# Patient Record
Sex: Male | Born: 1990 | Race: Black or African American | Hispanic: No | Marital: Married | Smoking: Former smoker
Health system: Southern US, Community
[De-identification: ages and names within clinical notes are randomized; demographics above are authoritative.]

## PROBLEM LIST (undated history)

## (undated) HISTORY — PX: NO PAST SURGERIES: SHX2092

---
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
## 2012-05-07 IMAGING — CR DG CHEST 2V
1 series · 2 of 2 positions shown · non-contrast
Comparison: none

REASON FOR EXAM: chest pain, shortness of breath
COMMENTS:

PROCEDURE:     DXR - DXR CHEST PA (OR AP) AND LATERAL  - December 30, 2010  [DATE]
RESULT:     The lung fields are clear. The heart, mediastinal and osseous
structures show no significant abnormalities.

[Series 1: w chest pa · 0.14mm/px · 2 of 2 slices shown]
[im 1/2]
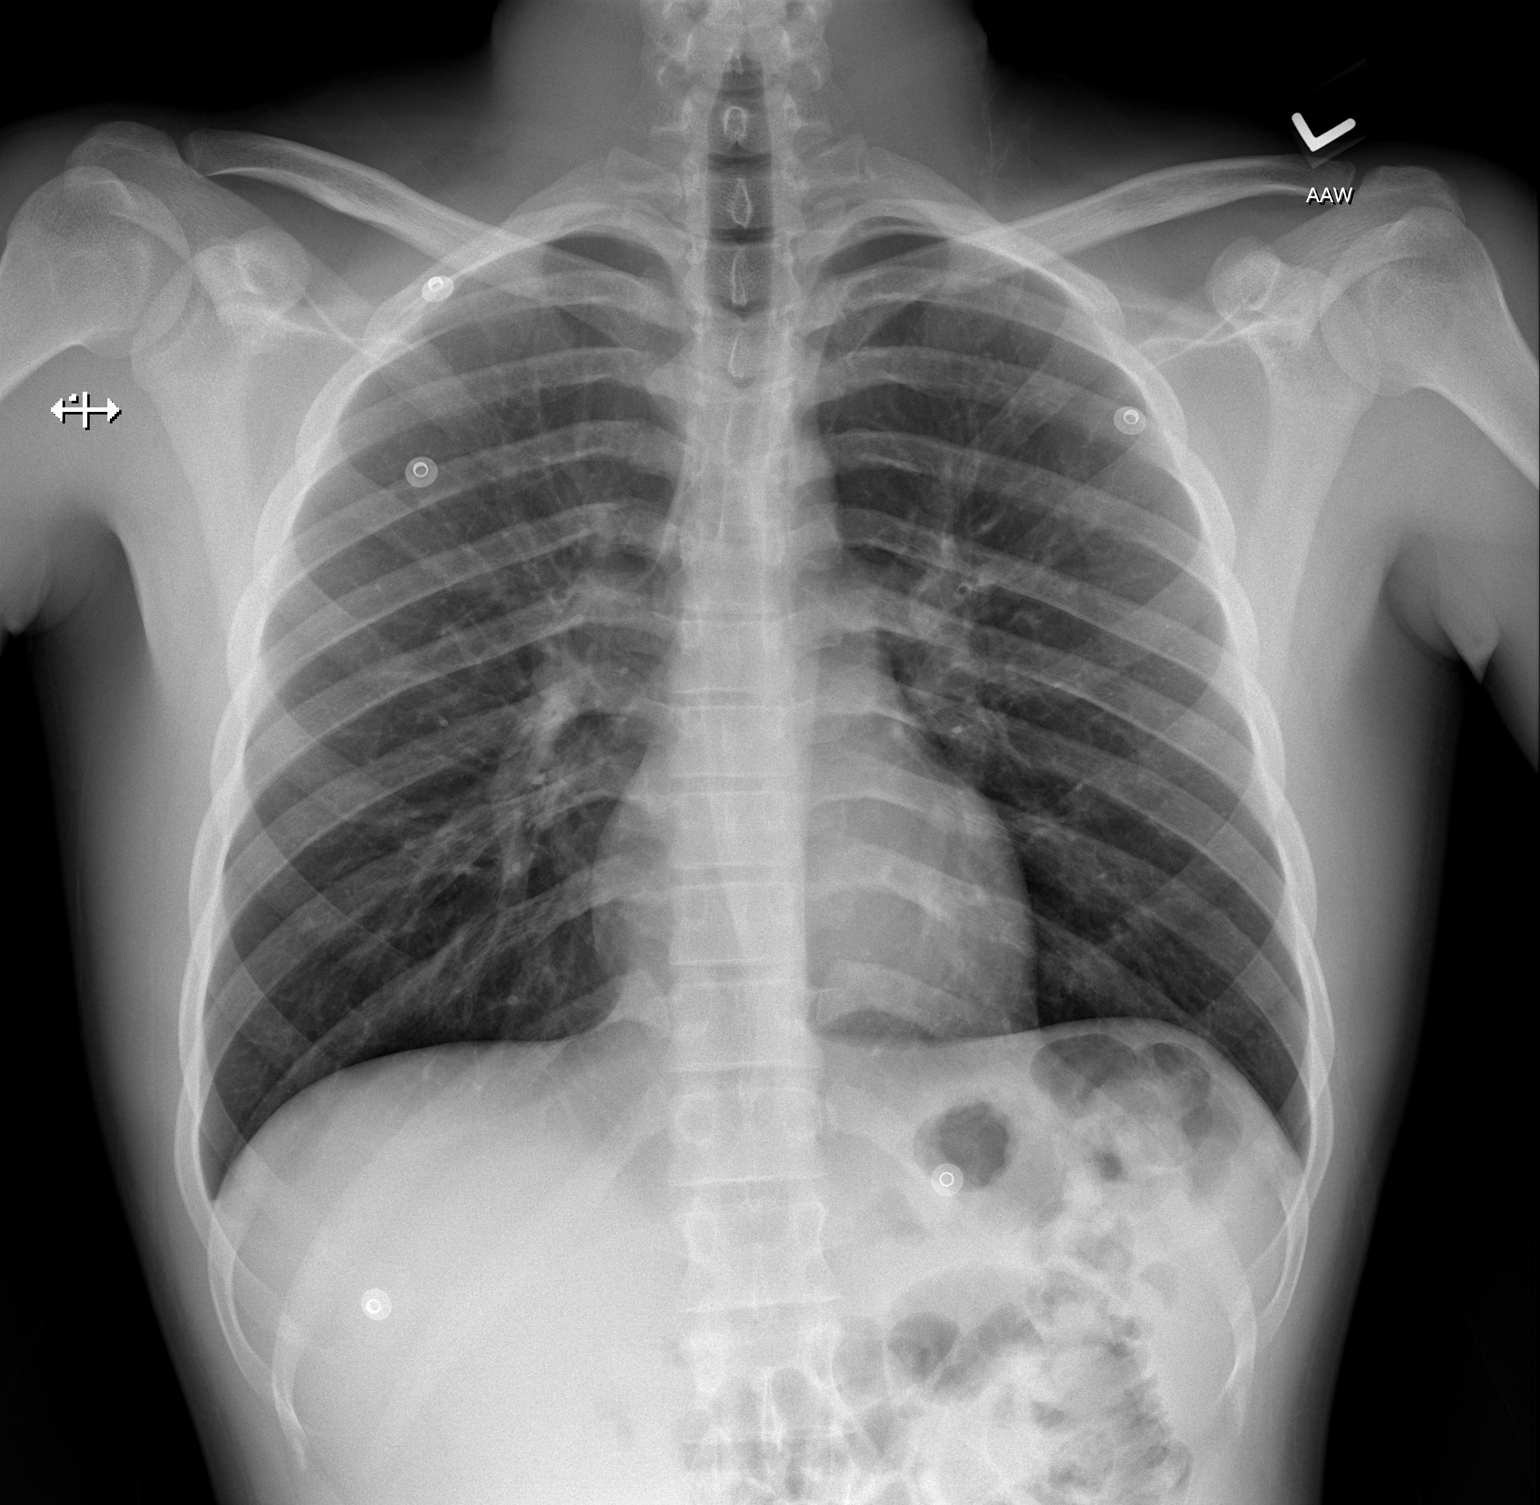
[im 2/2]
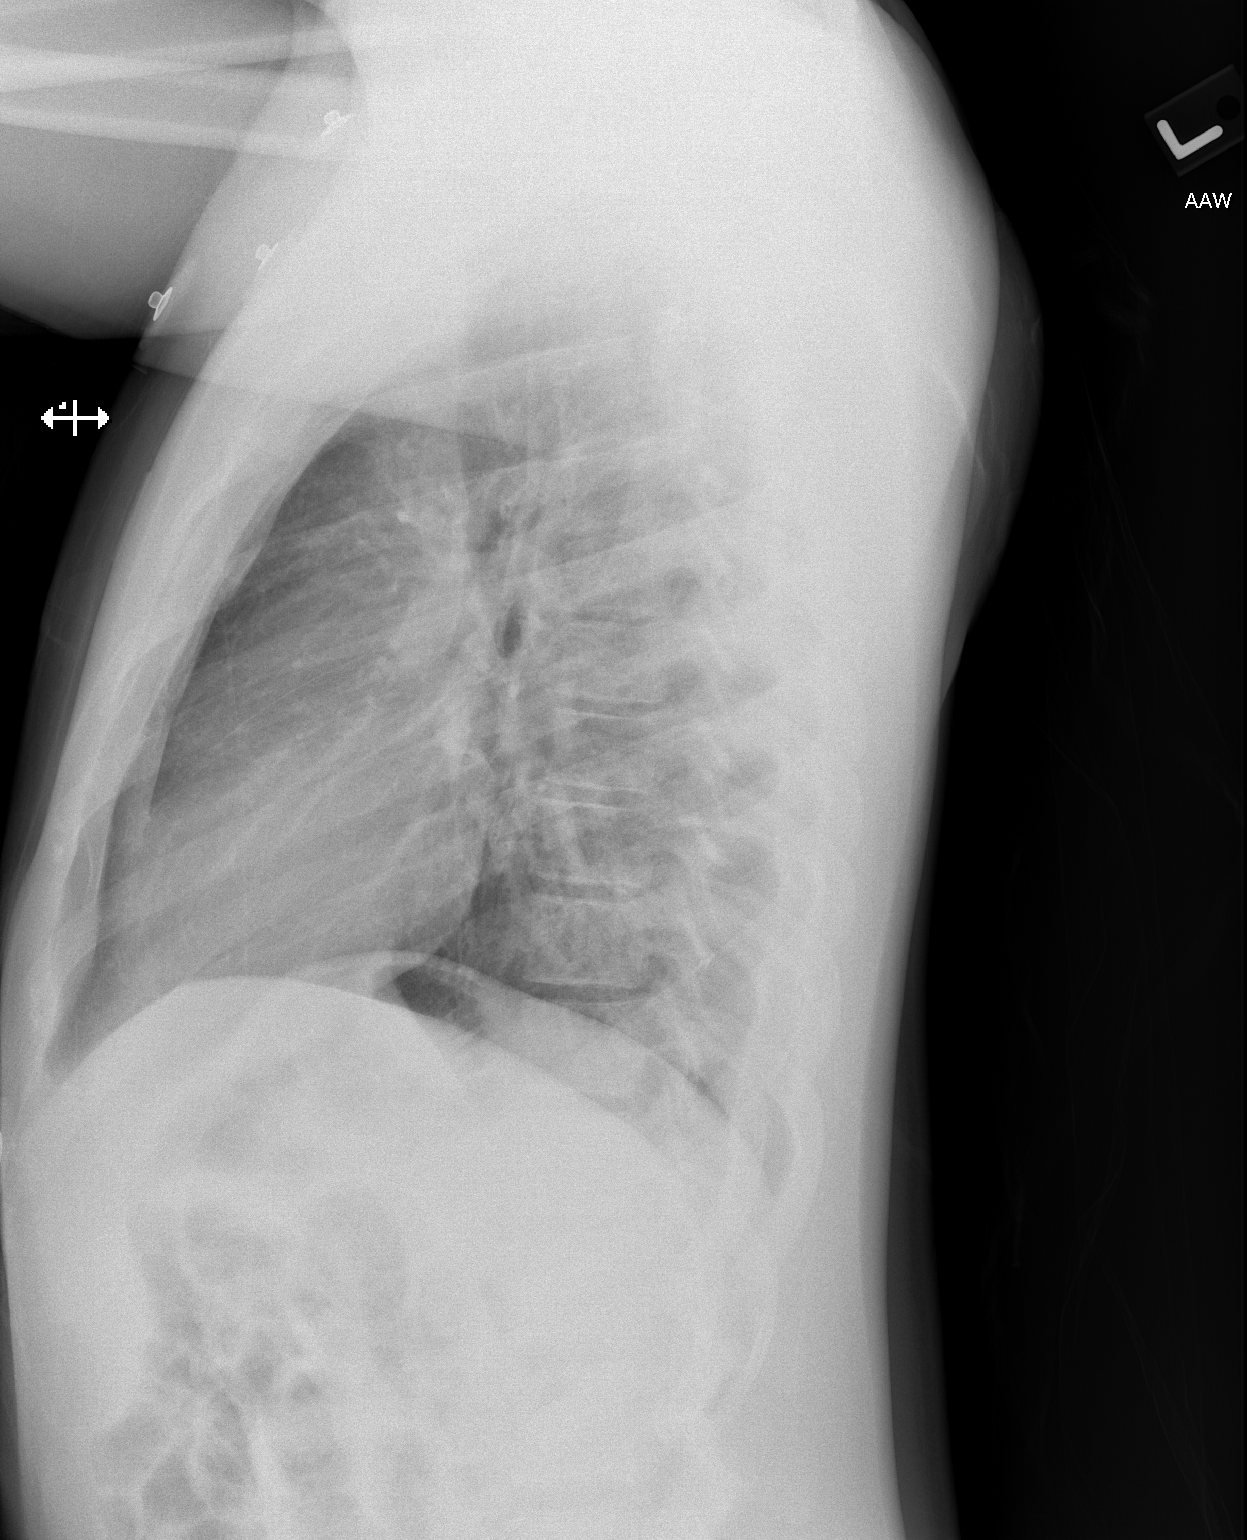

[2 of 2 positions shown; findings below may reference images not displayed]

IMPRESSION: 1.     No acute changes are identified.

## 2014-08-03 ENCOUNTER — Emergency Department
Admission: EM | Admit: 2014-08-03 | Discharge: 2014-08-03 | Disposition: A | Discharge status: Home / Self Care | Payer: Self-pay | Attending: Emergency Medicine | Admitting: Emergency Medicine

## 2014-08-03 ENCOUNTER — Encounter: Payer: Self-pay | Admitting: Emergency Medicine

## 2014-08-03 DIAGNOSIS — Z720 Tobacco use: Secondary | ICD-10-CM | POA: Insufficient documentation

## 2014-08-03 DIAGNOSIS — L02416 Cutaneous abscess of left lower limb: Secondary | ICD-10-CM | POA: Insufficient documentation

## 2014-08-03 MED ORDER — SULFAMETHOXAZOLE-TRIMETHOPRIM 800-160 MG PO TABS: 1.0000 | ORAL_TABLET | Freq: Two times a day (BID) | ORAL | Status: DC

## 2014-08-03 MED ORDER — HYDROCODONE-ACETAMINOPHEN 5-325 MG PO TABS
1.0000 | ORAL_TABLET | ORAL | Status: DC | PRN
Start: 2014-08-03 — End: 2017-04-27

## 2014-08-03 NOTE — ED Notes (Signed)
States he developed an abscess area to left groin area

## 2014-08-03 NOTE — ED Notes (Signed)
AAOx3.  Skin warm and dry.  Dry sterile dressing to left goin site, draining tan/brown purulent drainage.  Wound care discussed.  D/C home.

## 2014-08-03 NOTE — Discharge Instructions (Signed)
Abscess °An abscess (boil or furuncle) is an infected area on or under the skin. This area is filled with yellowish-white fluid (pus) and other material (debris). °HOME CARE  °· Only take medicines as told by your doctor. °· If you were given antibiotic medicine, take it as directed. Finish the medicine even if you start to feel better. °· If gauze is used, follow your doctor's directions for changing the gauze. °· To avoid spreading the infection: °¨ Keep your abscess covered with a bandage. °¨ Wash your hands well. °¨ Do not share personal care items, towels, or whirlpools with others. °¨ Avoid skin contact with others. °· Keep your skin and clothes clean around the abscess. °· Keep all doctor visits as told. °GET HELP RIGHT AWAY IF:  °· You have more pain, puffiness (swelling), or redness in the wound site. °· You have more fluid or blood coming from the wound site. °· You have muscle aches, chills, or you feel sick. °· You have a fever. °MAKE SURE YOU:  °· Understand these instructions. °· Will watch your condition. °· Will get help right away if you are not doing well or get worse. °Document Released: 06/29/2007 Document Revised: 07/12/2011 Document Reviewed: 03/25/2011 °ExitCare® Patient Information ©2015 [employer]. This information is not intended to replace advice given to you by your health care provider. Make sure you discuss any questions you have with your health care provider. ° °

## 2014-08-03 NOTE — ED Provider Notes (Signed)
[facility] Emergency Department Provider Note  ____________________________________________  Time seen: Approximately 1:37 PM  I have reviewed the triage vital signs and the nursing notes.   HISTORY  Chief Complaint Abscess   HPI Dennis Gaines is a 24 y.o. male is here with complaint of abscess in the left groin area for approximately 3 days. He states that he has had these before in various places and had multiple incisions made. He Denies any fever, nausea or vomiting.He states that with walking he is having pain due to the abscess. Currently his pain is 9 out of 10. He has not taken any over-the-counter medication for this.   History reviewed. No pertinent past medical history.  There are no active problems to display for this patient.   History reviewed. No pertinent past surgical history.  Current Outpatient Rx  Name  Route  Sig  Dispense  Refill  . HYDROcodone-acetaminophen (NORCO/VICODIN) 5-325 MG per tablet   Oral   Take 1 tablet by mouth every 4 (four) hours as needed for moderate pain.   20 tablet   0   . sulfamethoxazole-trimethoprim (BACTRIM DS,SEPTRA DS) 800-160 MG per tablet   Oral   Take 1 tablet by mouth 2 (two) times daily.   20 tablet   0     Allergies Review of patient's allergies indicates no known allergies.  No family history on file.  Social History History  Substance Use Topics  . Smoking status: Current Every Day Smoker  . Smokeless tobacco: Not on file  . Alcohol Use: Yes    Review of Systems Constitutional: No fever/chills Eyes: No visual changes. Cardiovascular: Denies chest pain. Respiratory: Denies shortness of breath. Gastrointestinal: No abdominal pain.  No nausea, no vomiting.  Genitourinary: Negative for dysuria. Musculoskeletal: Negative for back pain. Skin: Negative for rash. Neurological: Negative for headaches, focal weakness or numbness.  10-point ROS otherwise  negative.  ____________________________________________   PHYSICAL EXAM:  VITAL SIGNS: ED Triage Vitals  Enc Vitals Group     BP 08/03/14 1309 130/85 mmHg     Pulse Rate 08/03/14 1309 97     Resp 08/03/14 1309 18     Temp 08/03/14 1309 97.8 F (36.6 C)     Temp Source 08/03/14 1309 Oral     SpO2 08/03/14 1309 99 %     Weight 08/03/14 1309 225 lb (102.059 kg)     Height 08/03/14 1309 5\' 6"  (1.676 m)     Head Cir --      Peak Flow --      Pain Score 08/03/14 1310 9     Pain Loc --      Pain Edu? --      Excl. in GC? --     Constitutional: Alert and oriented. Well appearing and in no acute distress. Eyes: Conjunctivae are normal. PERRL. EOMI. Head: Atraumatic. Nose: No congestion/rhinnorhea. Mouth/Throat: Mucous membranes are moist.  Oropharynx non-erythematous. Neck: No stridor.   Cardiovascular: Normal rate, regular rhythm. Grossly normal heart sounds.  Good peripheral circulation. Respiratory: Normal respiratory effort.  No retractions. Lungs CTAB. Gastrointestinal: Soft and nontender. No distention. Musculoskeletal: No lower extremity tenderness nor edema.  No joint effusions. Neurologic:  Normal speech and language. No gross focal neurologic deficits are appreciated. Speech is normal. No gait instability. Skin:  Skin is warm, dry and intact. No rash noted. Left inner thigh area there is a 2 and half to 3 cm abscess that is fluctuant. There is no surrounding cellulitis. There  are multiple scarred areas from previous I&D. Psychiatric: Mood and affect are normal. Speech and behavior are normal.  ____________________________________________   LABS (all labs ordered are listed, but only abnormal results are displayed)  Labs Reviewed - No data to display  PROCEDURES  Procedure(s) performed: None  Critical Care performed: No  ____________________________________________   INITIAL IMPRESSION / ASSESSMENT AND PLAN / ED COURSE  Pertinent labs & imaging results that  were available during my care of the patient were reviewed by me and considered in my medical decision making (see chart for details).  Prior to I&D patient stood to remove his underwear and abscess spontaneously ruptured with moderate amount of purulent drainage. Patient was started on Septra DS and given Norco as needed for pain. He is also to follow-up with [facility] ____________________________________________   FINAL CLINICAL IMPRESSION(S) / ED DIAGNOSES  Final diagnoses:  Abscess of left thigh      Tommi Rumps, PA-C 08/03/14 1543  Jene Every, MD 08/03/14 1544

## 2017-04-27 ENCOUNTER — Ambulatory Visit
Admission: EM | Admit: 2017-04-27 | Discharge: 2017-04-27 | Disposition: A | Discharge status: Home / Self Care | Payer: Self-pay | Attending: Family Medicine | Admitting: Family Medicine

## 2017-04-27 ENCOUNTER — Other Ambulatory Visit: Payer: Self-pay

## 2017-04-27 DIAGNOSIS — Z113 Encounter for screening for infections with a predominantly sexual mode of transmission: Secondary | ICD-10-CM

## 2017-04-27 LAB — CHLAMYDIA/NGC RT PCR (ARMC ONLY)
Chlamydia Tr: NOT DETECTED
N gonorrhoeae: NOT DETECTED

## 2017-04-27 NOTE — ED Provider Notes (Signed)
[facility]  CSN: [id number] Arrival date & time: 04/27/17  1048  History   Chief Complaint Chief Complaint  Patient presents with  . SEXUALLY TRANSMITTED DISEASE   HPI  27 year old male presents for STD screening.  Patient states that he is a symptomatic.  He has no penile discharge.  No pain.  Dysuria.  He desires STD screening today as he has unprotected intercourse with his partner.  Patient states that he just wants to be checked.  No abdominal pain.  No other associated symptoms.  No other complaints or concerns at this time.  PMH: Hx of abcess.  Surgical Hx: No past surgeries.  Family History Family History  Problem Relation Age of Onset  . Bipolar disorder Mother   . Schizophrenia Mother   . Hypertension Father   . Diabetes Father   . Heart failure Father    Social History Social History   Tobacco Use  . Smoking status: Former Games developer  . Smokeless tobacco: Never Used  Substance Use Topics  . Alcohol use: Yes    Comment: occasionally  . Drug use: Yes    Types: Marijuana   Allergies   Tylenol [acetaminophen]  Review of Systems Review of Systems Per HPI  Physical Exam Triage Vital Signs ED Triage Vitals [04/27/17 1111]  Enc Vitals Group     BP      Pulse      Resp      Temp      Temp src      SpO2      Weight      Height      Head Circumference      Peak Flow      Pain Score 0     Pain Loc      Pain Edu?      Excl. in GC?    Updated Vital Signs BP (!) 145/92 (BP Location: Left Arm)   Pulse 68   Temp 98.6 F (37 C) (Oral)   Resp 18   Ht 5\' 4"  (1.626 m)   Wt 190 lb (86.2 kg)   SpO2 100%   BMI 32.61 kg/m   Physical Exam  Constitutional: He appears well-developed. No distress.  Cardiovascular: Normal rate and regular rhythm.  Pulmonary/Chest: Effort normal and breath sounds normal.  Abdominal: Soft. He exhibits no distension. There is no tenderness.  Neurological: He is alert.  Psychiatric: He has a normal mood and  affect. His behavior is normal.  Nursing note and vitals reviewed.  UC Treatments / Results  Labs (all labs ordered are listed, but only abnormal results are displayed) Labs Reviewed  CHLAMYDIA/NGC RT PCR ([facility] ONLY)  HIV ANTIBODY (ROUTINE TESTING)  RPR    EKG None Radiology No results found.  Procedures Procedures (including critical care time)  Medications Ordered in UC Medications - No data to display   Initial Impression / Assessment and Plan / UC Course  I have reviewed the triage vital signs and the nursing notes.  Pertinent labs & imaging results that were available during my care of the patient were reviewed by me and considered in my medical decision making (see chart for details).    27 year old male presents for STD screening.  Labs as above.  We will call with results.  Final Clinical Impressions(s) / UC Diagnoses   Final diagnoses:  Screening for STD (sexually transmitted disease)    ED Discharge Orders    None     Controlled Substance Prescriptions [place]  Controlled Substance Registry consulted? Not Applicable   Tommie SamsCook, Brittan Mapel G, DO 04/27/17 1130

## 2017-04-27 NOTE — ED Triage Notes (Signed)
Patient states that he is here today to be checked for STDS. Patient denies any symptoms. [facility]

## 2017-04-27 NOTE — Discharge Instructions (Addendum)
We will call with the results.  Take care  Dr. Josephus Harriger  

## 2017-04-28 ENCOUNTER — Telehealth (HOSPITAL_COMMUNITY): Payer: Self-pay

## 2017-04-28 LAB — HIV ANTIBODY (ROUTINE TESTING W REFLEX): HIV Screen 4th Generation wRfx: NONREACTIVE

## 2017-04-28 LAB — RPR: RPR Ser Ql: NONREACTIVE

## 2017-04-28 NOTE — Telephone Encounter (Signed)
Attempted to reach patient regarding normal test results and negative std screening. No answer at this time, voicemail left.

## 2019-07-15 ENCOUNTER — Other Ambulatory Visit: Payer: Self-pay

## 2019-07-15 ENCOUNTER — Ambulatory Visit
Admission: EM | Admit: 2019-07-15 | Discharge: 2019-07-15 | Disposition: A | Discharge status: Home / Self Care | Payer: Self-pay | Attending: Emergency Medicine | Admitting: Emergency Medicine

## 2019-07-15 DIAGNOSIS — L02214 Cutaneous abscess of groin: Secondary | ICD-10-CM | POA: Insufficient documentation

## 2019-07-15 DIAGNOSIS — Z711 Person with feared health complaint in whom no diagnosis is made: Secondary | ICD-10-CM | POA: Insufficient documentation

## 2019-07-15 LAB — CHLAMYDIA/NGC RT PCR (ARMC ONLY)
Chlamydia Tr: DETECTED — AB
N gonorrhoeae: NOT DETECTED

## 2019-07-15 MED ORDER — DOXYCYCLINE HYCLATE 100 MG PO CAPS: 100.0000 mg | ORAL_CAPSULE | Freq: Two times a day (BID) | ORAL | 0 refills | Status: AC

## 2019-07-15 NOTE — ED Triage Notes (Signed)
Pt states he had unprotected sex on 6/17. No symptoms but wants STD testing.

## 2019-07-15 NOTE — ED Provider Notes (Signed)
HPI  SUBJECTIVE:  Dennis Gaines is a 29 y.o. male who presents with  2 concerns.  First, requesting STD screening after having unprotected sex this weekend with 2 male partners.  States that he had oral sex only with 1, unprotected vaginal sex with the other.  They are both asymptomatic to his knowledge.  No penile rash or discharge, abdominal, pelvic pain.  No urinary complaints.  No scrotal, testicular pain or swelling.    Second, states that he has a site in his right inguinal area that is not healing from an I&D done 8 months ago in the ED in his right groin.  It is intermittently itchy.  He denies pain.  He reports occasional drainage, is not sure if it is serous fluid or purulent material.  He has been keeping it clean with soap and water, applying Neosporin without improvement in symptoms. no Aggravating factors.  Past medical history of chlamydia.  No history of gonorrhea, trichomonas, syphilis, HIV.  No history of MRSA, diabetes, cancer, immunocompromise.  PMD: None.  History reviewed. No pertinent past medical history.  Past Surgical History:  Procedure Laterality Date  . NO PAST SURGERIES      Family History  Problem Relation Age of Onset  . Bipolar disorder Mother   . Schizophrenia Mother   . Hypertension Father   . Diabetes Father   . Heart failure Father     Social History   Tobacco Use  . Smoking status: Former Research scientist (life sciences)  . Smokeless tobacco: Never Used  Vaping Use  . Vaping Use: Former  Substance Use Topics  . Alcohol use: Yes    Comment: occasionally  . Drug use: Yes    Types: Marijuana    No current facility-administered medications for this encounter. No current outpatient medications on file.  Allergies  Allergen Reactions  . Tylenol [Acetaminophen] Swelling     ROS  As noted in HPI.   Physical Exam  BP 104/72 (BP Location: Left Arm)   Pulse 80   Temp 98.1 F (36.7 C) (Oral)   Resp 16   Ht 5' 6.5" (1.689 m)   Wt 86.2 kg   SpO2 99%   BMI  30.21 kg/m   Constitutional: Well developed, well nourished, no acute distress Eyes:  EOMI, conjunctiva normal bilaterally HENT: Normocephalic, atraumatic,mucus membranes moist Respiratory: Normal inspiratory effort Cardiovascular: Normal rate GI: nondistended skin: No rash, skin intact GU: 2 areas of drainage, expressible purulent drainage right groin/scrotum..  3 x 2 cm nontender area of induration with no central fluctuance. No erythemal.  Positive inguinal lymph nodes right side.  No left-sided inguinal lymphadenopathy.      Musculoskeletal: no deformities Neurologic: Alert & oriented x 3, no focal neuro deficits Psychiatric: Speech and behavior appropriate   ED Course   Medications - No data to display  Orders Placed This Encounter  Procedures  . Wood rt PCR ([place] only)    Standing Status:   Standing    Number of Occurrences:   1  . Aerobic Culture (superficial specimen)    Standing Status:   Standing    Number of Occurrences:   1    No results found for this or any previous visit (from the past 24 hour(s)). No results found.  ED Clinical Impression  No diagnosis found.   ED Assessment/Plan  1.  STD screening.  Sending off gonorrhea, chlamydia, trichomonas.  Patient declined HIV, RPR testing.  2.  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  Do not think that it needs to be cut open again.  Suspect poor healing from chronic dampness and Neosporin use.  Will send home with doxycycline for 7 days.  Wound culture sent.  Advised to keep area clean and dry as possible.  Local wound care.  In the differential is hidradenitis suppurativa.  Will provide primary care list and referral to dermatology.   No orders of the defined types were placed in this encounter.   *This clinic note was created using Dragon dictation software. Therefore, there may be occasional mistakes despite careful proofreading.   ?    Domenick Gong, MD 07/16/19  [phone]

## 2019-07-15 NOTE — Discharge Instructions (Addendum)
Go to the health department for HIV and syphilis screening.  We will call you if any of your labs come back abnormal.  As for the abscess, trying keep it clean and dry as possible.  Stop the Neosporin.  I am sending you home on antibiotics to help with the infection.  Follow-up with a provider on the primary care list or with Dennis Gaines. Here is a list of primary care providers who are taking new patients:  Dennis Gaines [address] [place] [address] [phone]  [facility] [place] [address]  [place] [address]  [phone]  [facility] [address] [place], [place] [address] [phone]  [facility] [address] [place]  [phone] [place], [place] [address]  Here are clinics/ other resources who will see you if you do not have insurance. Some have certain criteria that you must meet. Call them and find out what they are:  [facility]: [address] [place], [place] [address] Phone: [phone] Hours: First and Third Saturdays of each Month, 9 a.m. - 1 p.m.  [facility]: [address] [place], [place] [address] Phone: [phone] Hours: Tuesday, 4 p.m. - 8 p.m. Thursday, 1 p.m. - 8 p.m. Wednesday, 9 a.m. - [facility] [address] [place], [place] [address] Phone: [phone] Pharmacy Phone Number: [phone] Dental Phone Number: [phone] [facility] Insurance Help: [phone]  Dental Hours: Monday - Thursday, 8 a.m. - 6 p.m.  [facility] [address], [place], [place] [address] Phone: [phone] Pharmacy Phone Number: [phone] [facility] Insurance Help: [phone]  [facility] [place] [address] [place] [place], [place] [address] Phone: [phone] Pharmacy Phone Number: [phone] [facility] Insurance Help: [phone]  [facility] [address] [place], [place] [address] Phone:  [phone] [facility] Insurance Help: [phone]   [facility] [address], [place], [place] [address] Phone: [phone]  Go to www.goodrx.com to look up your medications. This will give you a list of where you can find your prescriptions at the most affordable prices. Or ask the pharmacist what the Dennis Gaines price is, or if they have any other discount programs available to help make your medication more affordable. This can be less expensive than what you would pay with insurance.

## 2019-07-16 LAB — TRICHOMONAS VAGINALIS, PROBE AMP: Trich vag by NAA: NEGATIVE

## 2019-07-18 LAB — AEROBIC CULTURE W GRAM STAIN (SUPERFICIAL SPECIMEN)

## 2019-07-19 ENCOUNTER — Telehealth (HOSPITAL_COMMUNITY): Payer: Self-pay | Admitting: Orthopedic Surgery

## 2019-07-19 MED ORDER — SULFAMETHOXAZOLE-TRIMETHOPRIM 800-160 MG PO TABS: 1.0000 | ORAL_TABLET | Freq: Two times a day (BID) | ORAL | 0 refills | Status: AC

## 2019-07-22 ENCOUNTER — Telehealth (HOSPITAL_COMMUNITY): Payer: Self-pay | Admitting: Orthopedic Surgery

## 2020-06-29 ENCOUNTER — Other Ambulatory Visit: Payer: Self-pay

## 2020-06-29 ENCOUNTER — Encounter: Payer: Self-pay | Admitting: Emergency Medicine

## 2020-06-29 ENCOUNTER — Ambulatory Visit
Admission: EM | Admit: 2020-06-29 | Discharge: 2020-06-29 | Disposition: A | Discharge status: Home / Self Care | Payer: Self-pay | Attending: Physician Assistant | Admitting: Physician Assistant

## 2020-06-29 DIAGNOSIS — Z87891 Personal history of nicotine dependence: Secondary | ICD-10-CM | POA: Insufficient documentation

## 2020-06-29 DIAGNOSIS — Z113 Encounter for screening for infections with a predominantly sexual mode of transmission: Secondary | ICD-10-CM | POA: Insufficient documentation

## 2020-06-29 DIAGNOSIS — S161XXA Strain of muscle, fascia and tendon at neck level, initial encounter: Secondary | ICD-10-CM | POA: Insufficient documentation

## 2020-06-29 LAB — URINALYSIS, COMPLETE (UACMP) WITH MICROSCOPIC
Bilirubin Urine: NEGATIVE
Glucose, UA: NEGATIVE mg/dL
Ketones, ur: NEGATIVE mg/dL
Leukocytes,Ua: NEGATIVE
Nitrite: NEGATIVE
Protein, ur: NEGATIVE mg/dL
Specific Gravity, Urine: 1.02 (ref 1.005–1.030)
pH: 7 (ref 5.0–8.0)

## 2020-06-29 LAB — CHLAMYDIA/NGC RT PCR (ARMC ONLY)
Chlamydia Tr: DETECTED — AB
N gonorrhoeae: NOT DETECTED

## 2020-06-29 MED ORDER — CYCLOBENZAPRINE HCL 10 MG PO TABS: 10.0000 mg | ORAL_TABLET | Freq: Three times a day (TID) | ORAL | 0 refills | Status: AC | PRN

## 2020-06-29 MED ORDER — NAPROXEN 500 MG PO TABS: 500.0000 mg | ORAL_TABLET | Freq: Two times a day (BID) | ORAL | 0 refills | Status: AC
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
## 2020-06-29 NOTE — Discharge Instructions (Addendum)
NECK PAIN: Stressed avoiding painful activities. This can exacerbate your symptoms and make them worse.  May apply heat to the areas of pain for some relief. Use medications as directed. Be aware of which medications make you drowsy and do not drive or operate any kind of heavy machinery while using the medication (ie pain medications or muscle relaxers). F/U with PCP for reexamination or return sooner if condition worsens or does not begin to improve over the next few days.   NECK PAIN RED FLAGS: If symptoms get worse than they are right now, you should come back sooner for re-evaluation. If you have increased numbness/ tingling or notice that the numbness/tingling is affecting the legs or saddle region, go to ER. If you ever lose continence go to ER.     
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
## 2020-07-01 ENCOUNTER — Telehealth (HOSPITAL_COMMUNITY): Payer: Self-pay | Admitting: Emergency Medicine

## 2020-07-01 LAB — TRICHOMONAS VAGINALIS, PROBE AMP: Trich vag by NAA: NEGATIVE

## 2020-07-01 MED ORDER — DOXYCYCLINE HYCLATE 100 MG PO CAPS
100.0000 mg | ORAL_CAPSULE | Freq: Two times a day (BID) | ORAL | 0 refills | Status: AC
Start: 2020-07-01 — End: 2020-07-08
